# Patient Record
Sex: Male | Born: 1945 | Race: White | Hispanic: No | Marital: Married
Health system: Southern US, Community
[De-identification: ages and names within clinical notes are randomized; demographics above are authoritative.]

## PROBLEM LIST (undated history)

## (undated) DIAGNOSIS — S0990XA Unspecified injury of head, initial encounter: Secondary | ICD-10-CM

## (undated) DIAGNOSIS — F0390 Unspecified dementia without behavioral disturbance: Secondary | ICD-10-CM

## (undated) DIAGNOSIS — N390 Urinary tract infection, site not specified: Secondary | ICD-10-CM

---

## 2003-11-17 ENCOUNTER — Other Ambulatory Visit: Payer: Self-pay

## 2004-09-14 ENCOUNTER — Ambulatory Visit: Payer: Self-pay | Admitting: Family Medicine

## 2005-04-12 ENCOUNTER — Ambulatory Visit: Payer: Self-pay | Admitting: Internal Medicine

## 2006-02-16 ENCOUNTER — Ambulatory Visit: Payer: Self-pay | Admitting: Family Medicine

## 2006-03-21 ENCOUNTER — Ambulatory Visit: Payer: Self-pay | Admitting: Internal Medicine

## 2006-10-04 ENCOUNTER — Other Ambulatory Visit: Payer: Self-pay

## 2006-10-04 ENCOUNTER — Inpatient Hospital Stay: Payer: Self-pay | Admitting: Internal Medicine

## 2007-02-16 ENCOUNTER — Emergency Department: Payer: Self-pay | Admitting: Emergency Medicine

## 2007-02-16 ENCOUNTER — Inpatient Hospital Stay (HOSPITAL_COMMUNITY): Admission: EM | Admit: 2007-02-16 | Discharge: 2007-02-24 | Payer: Self-pay | Admitting: Emergency Medicine

## 2007-02-16 ENCOUNTER — Ambulatory Visit: Payer: Self-pay | Admitting: Critical Care Medicine

## 2007-02-17 ENCOUNTER — Encounter (INDEPENDENT_AMBULATORY_CARE_PROVIDER_SITE_OTHER): Payer: Self-pay | Admitting: *Deleted

## 2007-03-15 ENCOUNTER — Ambulatory Visit: Payer: Self-pay | Admitting: Family Medicine

## 2009-03-07 DEATH — deceased
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
## 2009-04-01 IMAGING — CR DG CHEST 1V PORT
1 series · 1 of 1 positions shown · non-contrast
Comparison: 02/19/2007

CLINICAL DATA: Shortness of breath and respiratory distress. 
 PORTABLE CHEST - 1 VIEW:

[view not recorded]
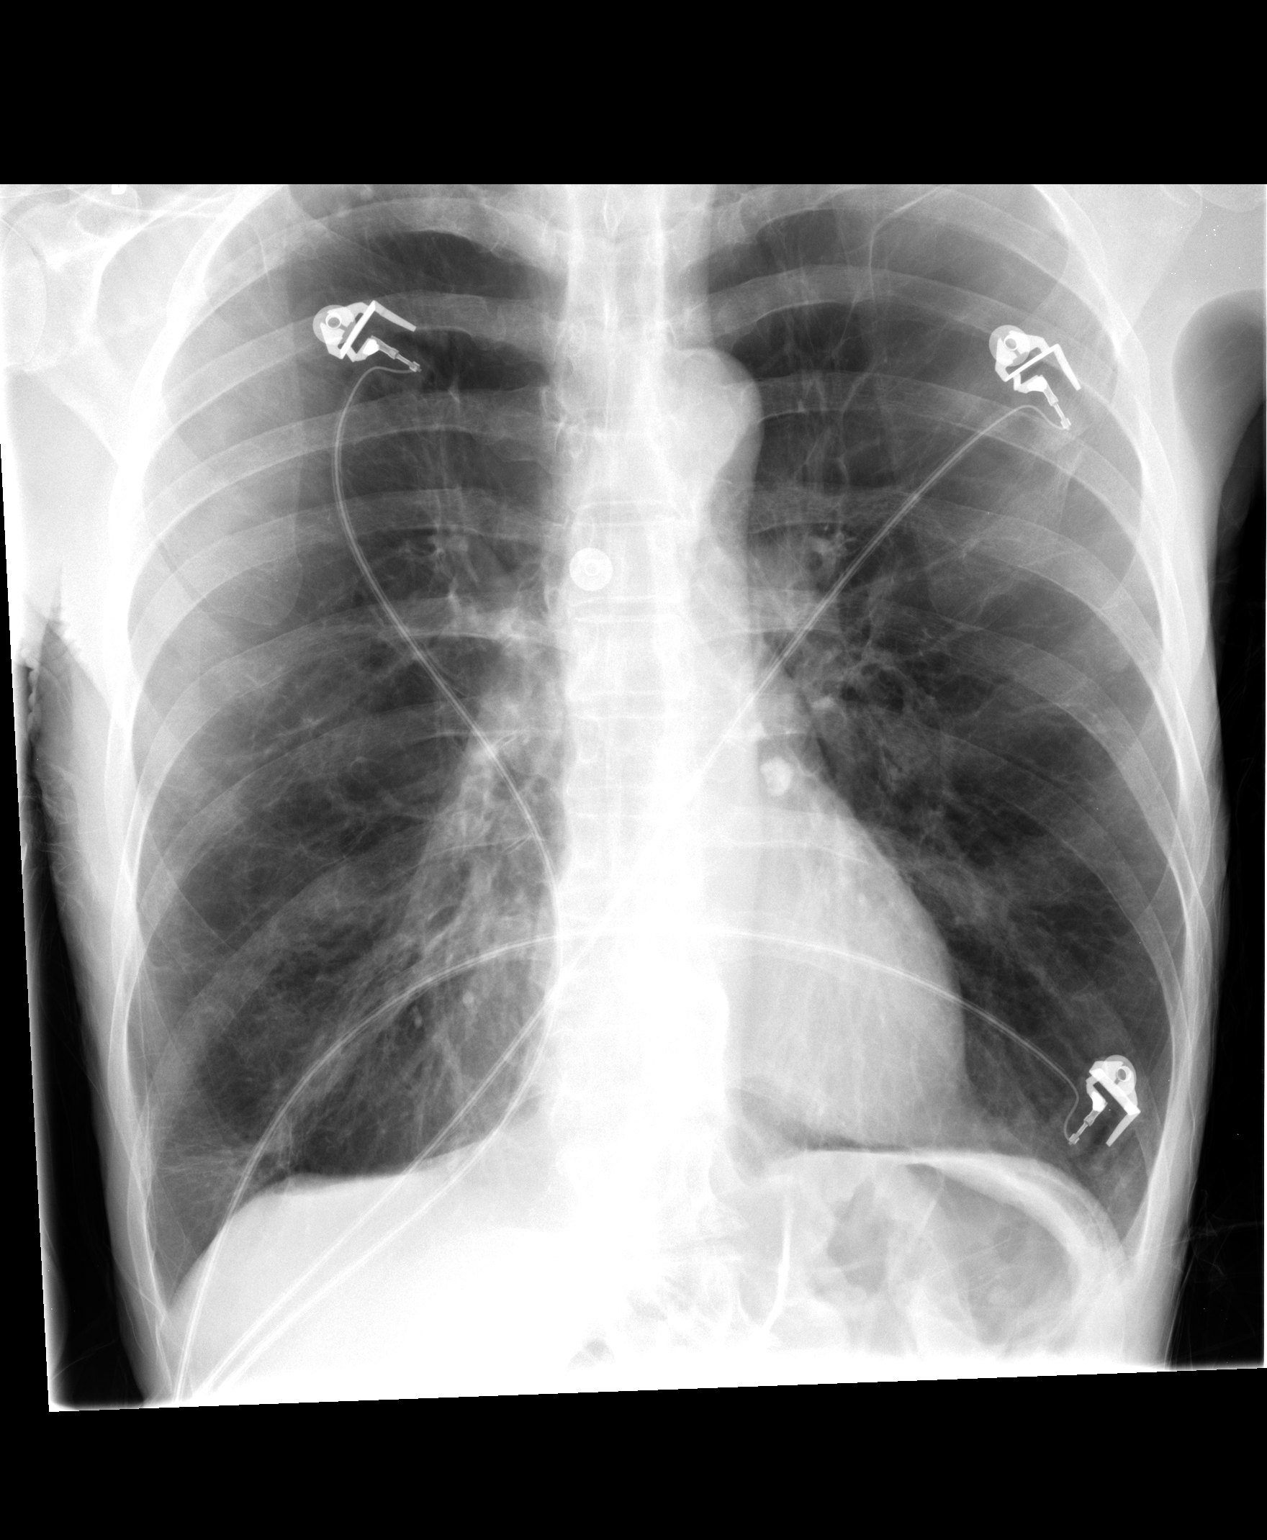

[1 of 1 positions shown; findings below may reference images not displayed]

FINDINGS: Severe bullous emphysematous disease is again seen.  Lungs are clear.  Calcified hilar lymph nodes are noted.  No pleural effusion. Heart size normal.
IMPRESSION: Severe emphysema without acute disease.

## 2009-04-23 IMAGING — CR DG CHEST 2V
1 series · 2 of 2 positions shown · non-contrast
Comparison: none

REASON FOR EXAM: Weight loss, fever, chills
COMMENTS:

[Series 1: view not recorded · 0.17mm/px · 2 of 2 slices shown]
[im 1/2]
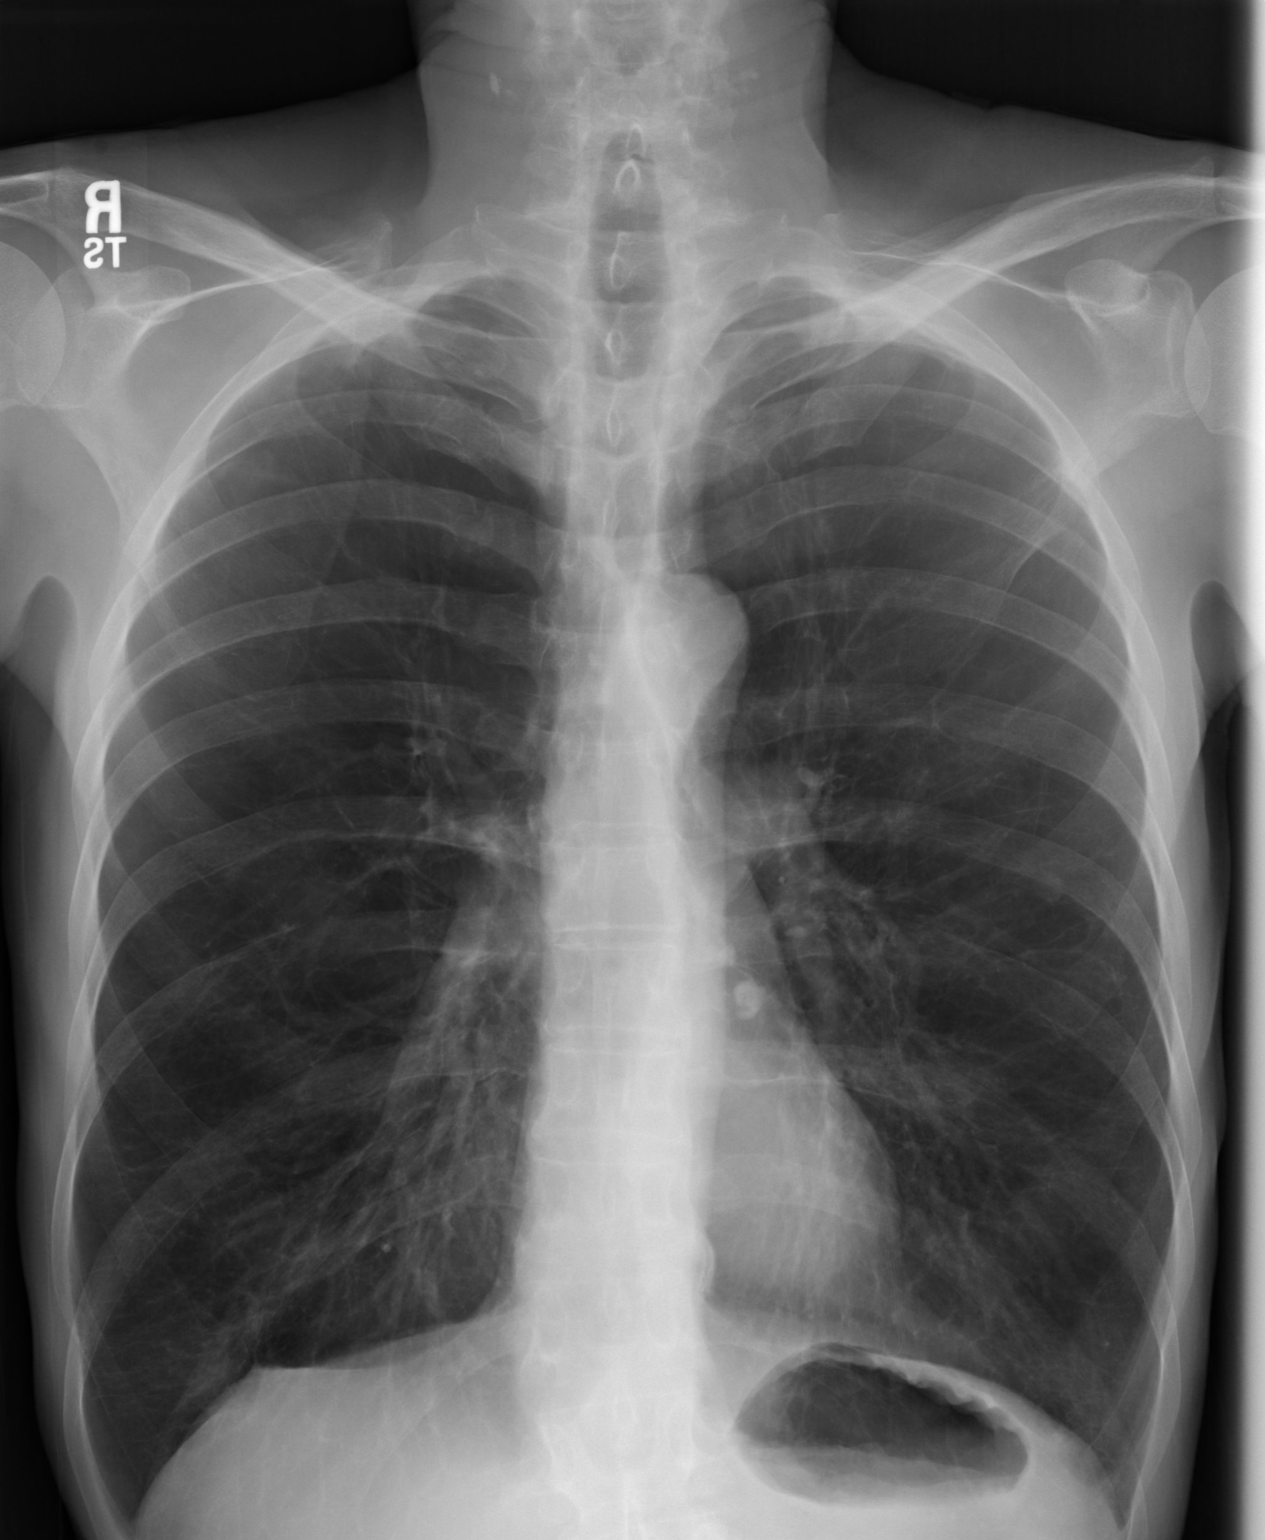
[im 2/2]
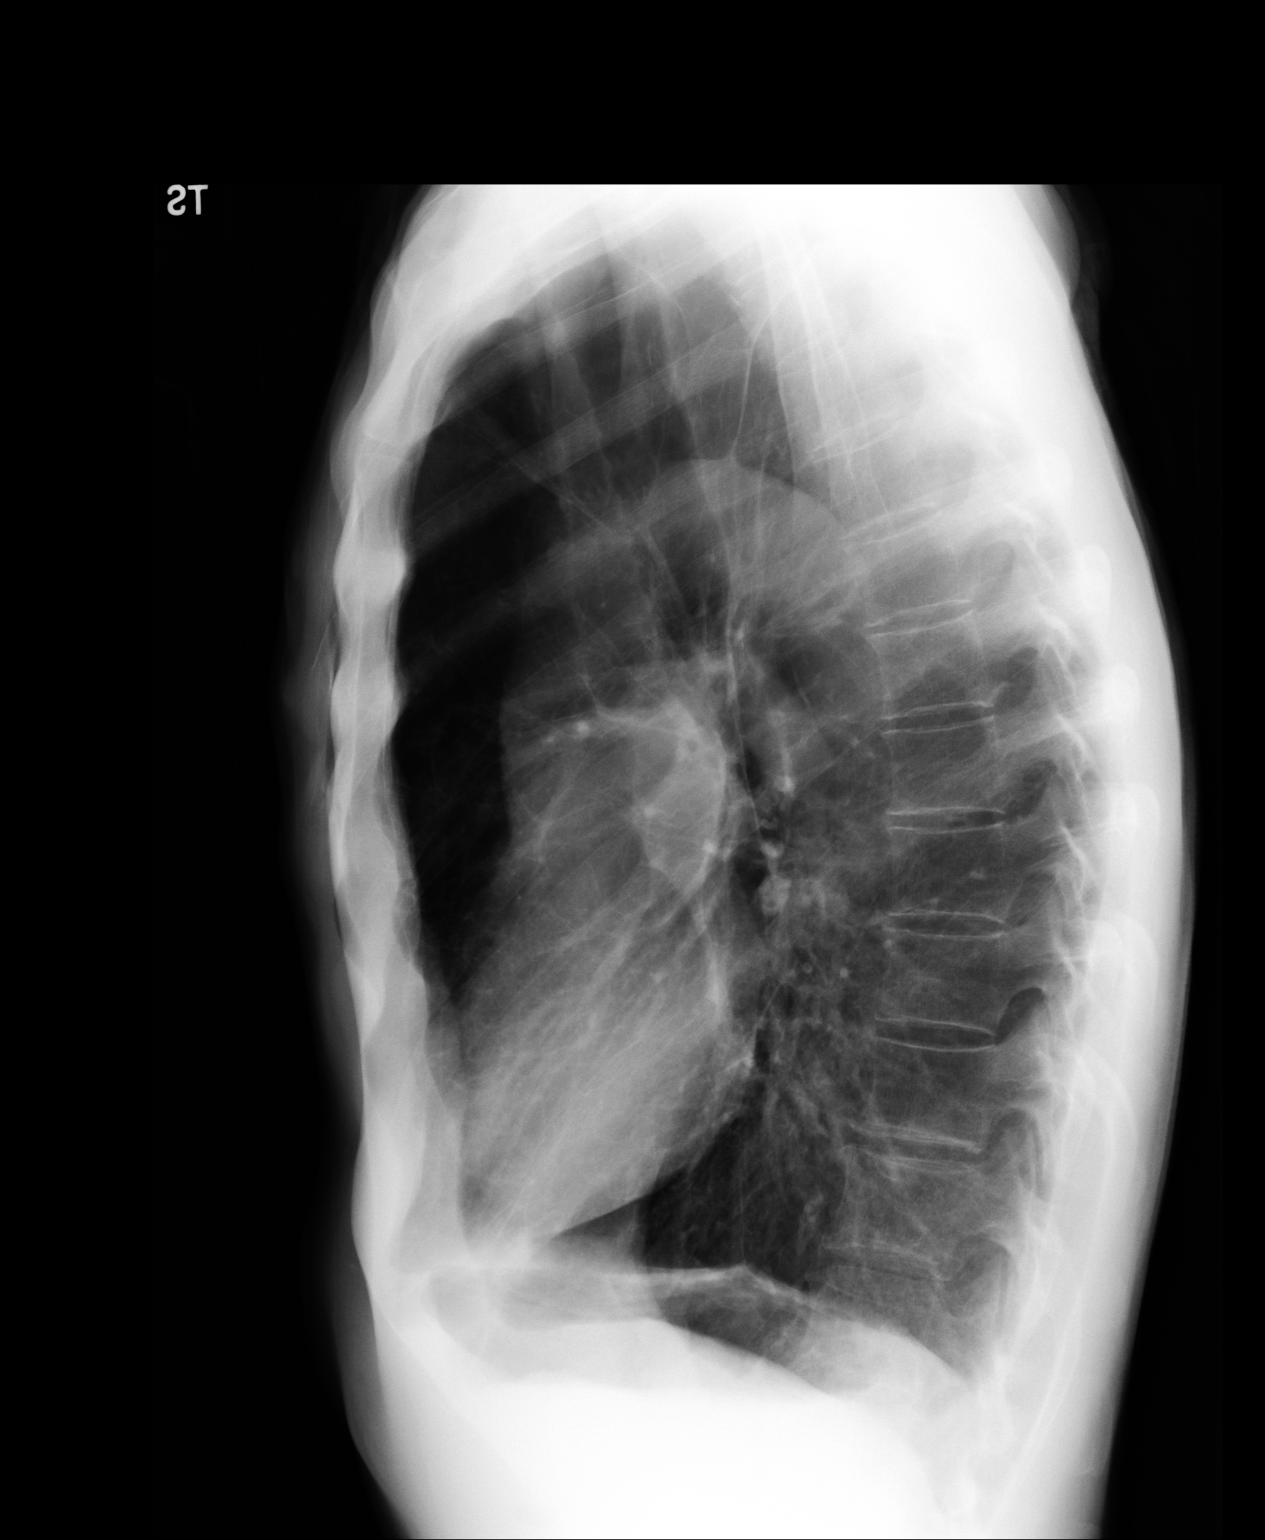

[2 of 2 positions shown; findings below may reference images not displayed]

PROCEDURE:     DXR - DXR CHEST PA (OR AP) AND LATERAL  - March 15, 2007  [DATE]

RESULT:     Comparison is made to a prior exam of 02/16/2007.

A few, calcified granulomas are again noted inferior to the LEFT hilum. No
new pulmonary infiltrates are seen. No lung mass is observed. There is no
pleural effusion. The heart size is normal. The chest is hyperexpanded
compatible with COPD.
IMPRESSION: 1.  The lung fields are clear.
2.  The heart size is normal.
3.  COPD.
4.  No acute changes are seen as compared to the prior exam of 02/16/2007.
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
## 2010-12-10 LAB — COMPREHENSIVE METABOLIC PANEL
AST: 23
AST: 25
Albumin: 3 — ABNORMAL LOW
BUN: 4 — ABNORMAL LOW
BUN: 8
CO2: 31
CO2: 37 — ABNORMAL HIGH
Calcium: 8.3 — ABNORMAL LOW
Chloride: 97
Creatinine, Ser: 0.74
GFR calc Af Amer: >60
GFR calc non Af Amer: >60
Glucose, Bld: 106 — ABNORMAL HIGH
Glucose, Bld: 153 — ABNORMAL HIGH
Potassium: 3.7
Total Bilirubin: 0.5
Total Protein: 5.3 — ABNORMAL LOW

## 2010-12-10 LAB — POCT I-STAT 3, ART BLOOD GAS (G3+)
Bicarbonate: 25 — ABNORMAL HIGH
O2 Saturation: 89

## 2010-12-10 LAB — CBC
HCT: 28.3 — ABNORMAL LOW
Hemoglobin: 10.8 — ABNORMAL LOW
MCHC: 33.3
MCV: 86.3
Platelets: 144 — ABNORMAL LOW
Platelets: 203
RBC: 3.29 — ABNORMAL LOW
RDW: 13.9
RDW: 14.3
WBC: 7.9

## 2010-12-10 LAB — CARDIAC PANEL(CRET KIN+CKTOT+MB+TROPI)
CK, MB: 2.9
Relative Index: INVALID

## 2010-12-10 LAB — MAGNESIUM: Magnesium: 1.8

## 2010-12-10 LAB — BLOOD GAS, ARTERIAL
Bicarbonate: 35.6 — ABNORMAL HIGH
Delivery systems: POSITIVE
FIO2: 0.3
PEEP: 5
Pressure support: 10
pCO2 arterial: 51.8 — ABNORMAL HIGH
pH, Arterial: 7.452 — ABNORMAL HIGH

## 2010-12-10 LAB — FOLATE RBC: RBC Folate: 364

## 2010-12-13 LAB — RAPID URINE DRUG SCREEN, HOSP PERFORMED
Amphetamines: NOT DETECTED
Barbiturates: NOT DETECTED
Benzodiazepines: NOT DETECTED
Tetrahydrocannabinol: NOT DETECTED

## 2010-12-13 LAB — POCT I-STAT 3, ART BLOOD GAS (G3+)
Acid-Base Excess: 3 — ABNORMAL HIGH
Acid-Base Excess: 4 — ABNORMAL HIGH
Acid-base deficit: 1
Bicarbonate: 25.6 — ABNORMAL HIGH
Bicarbonate: 29.1 — ABNORMAL HIGH
O2 Saturation: 85
O2 Saturation: 97
Operator id: 143801
Operator id: 270211
Patient temperature: 37
Patient temperature: 98.7
TCO2: 28
pCO2 arterial: 39.2
pH, Arterial: 7.43
pO2, Arterial: 56 — ABNORMAL LOW

## 2010-12-13 LAB — DIFFERENTIAL
Basophils Absolute: 0.1
Basophils Relative: 0
Eosinophils Relative: 0
Lymphocytes Relative: 17
Lymphocytes Relative: 5 — ABNORMAL LOW
Lymphs Abs: 0.6 — ABNORMAL LOW
Monocytes Absolute: 0.7
Monocytes Relative: 6
Neutro Abs: 9.7 — ABNORMAL HIGH
Neutro Abs: 9.9 — ABNORMAL HIGH

## 2010-12-13 LAB — BASIC METABOLIC PANEL
BUN: 14
CO2: 29
Calcium: 8.1 — ABNORMAL LOW
Calcium: 9.4
Chloride: 100
Chloride: 102
Creatinine, Ser: 0.95
Creatinine, Ser: 1.27
GFR calc Af Amer: >60
GFR calc non Af Amer: 57 — ABNORMAL LOW
Glucose, Bld: 88
Sodium: 137

## 2010-12-13 LAB — I-STAT 8, (EC8 V) (CONVERTED LAB)
BUN: 16
Chloride: 102
Glucose, Bld: 82
HCT: 38 — ABNORMAL LOW
Hemoglobin: 12.9 — ABNORMAL LOW
pCO2, Ven: 51 — ABNORMAL HIGH

## 2010-12-13 LAB — CBC
HCT: 31.4 — ABNORMAL LOW
HCT: 35.3 — ABNORMAL LOW
Hemoglobin: 10.4 — ABNORMAL LOW
Hemoglobin: 11.6 — ABNORMAL LOW
MCHC: 33.5
MCV: 84.4
Platelets: 126 — ABNORMAL LOW
Platelets: 221
RBC: 3.67 — ABNORMAL LOW
RDW: 13.6
WBC: 8.6

## 2010-12-13 LAB — RPR: RPR Ser Ql: NONREACTIVE

## 2010-12-13 LAB — ETHANOL: Alcohol, Ethyl (B): <5

## 2010-12-13 LAB — TSH: TSH: 1.667

## 2010-12-13 LAB — POCT I-STAT CREATININE: Operator id: 192351

## 2010-12-13 LAB — CK TOTAL AND CKMB (NOT AT ARMC): Relative Index: 4.9 — ABNORMAL HIGH

## 2010-12-13 LAB — TROPONIN I: Troponin I: 0.02

## 2016-01-04 ENCOUNTER — Encounter (HOSPITAL_COMMUNITY): Payer: Self-pay

## 2016-01-04 ENCOUNTER — Emergency Department (HOSPITAL_COMMUNITY)
Admission: EM | Admit: 2016-01-04 | Discharge: 2016-01-04 | Disposition: A | Discharge status: Home / Self Care | Payer: Self-pay | Attending: Dermatology | Admitting: Dermatology

## 2016-01-04 DIAGNOSIS — Z5321 Procedure and treatment not carried out due to patient leaving prior to being seen by health care provider: Secondary | ICD-10-CM | POA: Insufficient documentation

## 2016-01-04 DIAGNOSIS — Z0389 Encounter for observation for other suspected diseases and conditions ruled out: Secondary | ICD-10-CM | POA: Insufficient documentation

## 2016-01-04 HISTORY — DX: Unspecified dementia, unspecified severity, without behavioral disturbance, psychotic disturbance, mood disturbance, and anxiety: F03.90

## 2016-01-04 HISTORY — DX: Urinary tract infection, site not specified: N39.0

## 2016-01-04 HISTORY — DX: Unspecified injury of head, initial encounter: S09.90XA

## 2016-01-04 NOTE — ED Triage Notes (Signed)
[facility]

## 2016-01-04 NOTE — ED Notes (Signed)
Upon entering room pt is sitting at edge of bed and swinging both legs. Pt denies any pain and moved up in bed. Call light within reach, urinal offered, bed rails X2 up. Pt instructed not to get out of bed.

## 2016-01-04 NOTE — ED Notes (Signed)
Called nursing home to speak to nurse regarding pt's medical history. States she would fax information. States the patient has only been with them about a month. States pt had a mobile x-ray on 12/31/15 that suggest pt has a left femur fracture

## 2016-01-04 NOTE — ED Notes (Signed)
Pt ambulatory out of room repeatedly stating "I've got things to do, what's the hold up." Pt back in bed at this time, instructed to not get up, bed rails up X2, call light within reach.
